# Patient Record
Sex: Female | Born: 1965 | Race: White | Smoking: Never smoker
Health system: Southern US, Community
[De-identification: ages and names within clinical notes are randomized; demographics above are authoritative.]

## PROBLEM LIST (undated history)

## (undated) DIAGNOSIS — N871 Moderate cervical dysplasia: Secondary | ICD-10-CM

## (undated) DIAGNOSIS — R87613 High grade squamous intraepithelial lesion on cytologic smear of cervix (HGSIL): Secondary | ICD-10-CM

## (undated) HISTORY — DX: High grade squamous intraepithelial lesion on cytologic smear of cervix (HGSIL): R87.613

## (undated) HISTORY — DX: Moderate cervical dysplasia: N87.1

## (undated) HISTORY — PX: MOUTH SURGERY: SHX715

---

## 2007-12-11 ENCOUNTER — Ambulatory Visit: Payer: Self-pay | Admitting: Unknown Physician Specialty

## 2008-12-16 ENCOUNTER — Ambulatory Visit: Payer: Self-pay | Admitting: Unknown Physician Specialty

## 2010-10-19 ENCOUNTER — Ambulatory Visit: Payer: Self-pay | Admitting: Unknown Physician Specialty

## 2014-11-18 ENCOUNTER — Other Ambulatory Visit: Payer: Self-pay | Admitting: Obstetrics & Gynecology

## 2014-11-18 DIAGNOSIS — Z1231 Encounter for screening mammogram for malignant neoplasm of breast: Secondary | ICD-10-CM

## 2014-12-16 ENCOUNTER — Ambulatory Visit
Admission: RE | Admit: 2014-12-16 | Discharge: 2014-12-16 | Disposition: A | Discharge status: Home / Self Care | Payer: BLUE CROSS/BLUE SHIELD | Source: Ambulatory Visit | Attending: Obstetrics & Gynecology | Admitting: Obstetrics & Gynecology

## 2014-12-16 DIAGNOSIS — Z1231 Encounter for screening mammogram for malignant neoplasm of breast: Secondary | ICD-10-CM | POA: Diagnosis not present

## 2015-11-17 ENCOUNTER — Other Ambulatory Visit: Payer: Self-pay | Admitting: Obstetrics & Gynecology

## 2015-11-17 DIAGNOSIS — Z1231 Encounter for screening mammogram for malignant neoplasm of breast: Secondary | ICD-10-CM

## 2015-12-29 ENCOUNTER — Ambulatory Visit
Admission: RE | Admit: 2015-12-29 | Discharge: 2015-12-29 | Disposition: A | Discharge status: Home / Self Care | Payer: BLUE CROSS/BLUE SHIELD | Source: Ambulatory Visit | Attending: Obstetrics & Gynecology | Admitting: Obstetrics & Gynecology

## 2015-12-29 DIAGNOSIS — Z1231 Encounter for screening mammogram for malignant neoplasm of breast: Secondary | ICD-10-CM | POA: Diagnosis present

## 2015-12-29 LAB — HM MAMMOGRAPHY

## 2016-01-02 LAB — HM PAP SMEAR

## 2016-12-31 ENCOUNTER — Encounter: Payer: Self-pay | Admitting: Obstetrics & Gynecology

## 2017-01-10 ENCOUNTER — Ambulatory Visit (INDEPENDENT_AMBULATORY_CARE_PROVIDER_SITE_OTHER): Payer: BLUE CROSS/BLUE SHIELD | Admitting: Obstetrics & Gynecology

## 2017-01-10 ENCOUNTER — Encounter: Payer: Self-pay | Admitting: Obstetrics & Gynecology

## 2017-01-10 VITALS — BP 120/80 | HR 81 | Ht 63.0 in | Wt 130.0 lb

## 2017-01-10 DIAGNOSIS — Z01419 Encounter for gynecological examination (general) (routine) without abnormal findings: Secondary | ICD-10-CM

## 2017-01-10 DIAGNOSIS — Z131 Encounter for screening for diabetes mellitus: Secondary | ICD-10-CM | POA: Diagnosis not present

## 2017-01-10 DIAGNOSIS — Z1231 Encounter for screening mammogram for malignant neoplasm of breast: Secondary | ICD-10-CM | POA: Diagnosis not present

## 2017-01-10 DIAGNOSIS — Z87410 Personal history of cervical dysplasia: Secondary | ICD-10-CM | POA: Diagnosis not present

## 2017-01-10 DIAGNOSIS — E7800 Pure hypercholesterolemia, unspecified: Secondary | ICD-10-CM | POA: Diagnosis not present

## 2017-01-10 DIAGNOSIS — Z1329 Encounter for screening for other suspected endocrine disorder: Secondary | ICD-10-CM

## 2017-01-10 DIAGNOSIS — Z1321 Encounter for screening for nutritional disorder: Secondary | ICD-10-CM | POA: Diagnosis not present

## 2017-01-10 DIAGNOSIS — Z0000 Encounter for general adult medical examination without abnormal findings: Secondary | ICD-10-CM

## 2017-01-10 DIAGNOSIS — Z1239 Encounter for other screening for malignant neoplasm of breast: Secondary | ICD-10-CM

## 2017-01-10 NOTE — Patient Instructions (Addendum)
PAP every year Mammogram every year    Call [phone] to schedule at [facility] Colonoscopy every 10 years starting next year Labs yearly

## 2017-01-10 NOTE — Progress Notes (Signed)
HPI:      [name] is a 51 y.o. G2P1011 who LMP was Patient's last menstrual period was 12/23/2016., she presents today for her annual examination. The patient has no complaints today. The patient is sexually active. Her last pap: approximate date 2017 and was normal and has h/o ASCUS and dysplasia in past; and last mammogram: approximate date 2017 and was normal. The patient does perform self breast exams.  There is no notable family history of breast or ovarian cancer in her family.  The patient has regular exercise: yes.  The patient denies current symptoms of depression.   Stopped OCP and has had about 3 periods over last year, rare hot flash  GYN History: Contraception: OCP (estrogen/progesterone)- has stopped  PMHx: Past Medical History:  Diagnosis Date  . CIN II (cervical intraepithelial neoplasia II)   . HGSIL (high grade squamous intraepithelial lesion) on Pap smear of cervix   . Moderate cervical dysplasia    Past Surgical History:  Procedure Laterality Date  . MOUTH SURGERY     Family History  Problem Relation Age of Onset  . Diabetes Mother    Social History   Tobacco Use  . Smoking status: Never Smoker  . Smokeless tobacco: Never Used  Substance Use Topics  . Alcohol use: No    Frequency: Never  . Drug use: No   No current outpatient medications on file. Allergies: Patient has no known allergies.  Review of Systems  Constitutional: Negative for chills, fever and malaise/fatigue.  HENT: Negative for congestion, sinus pain and sore throat.   Eyes: Negative for blurred vision and pain.  Respiratory: Negative for cough and wheezing.   Cardiovascular: Negative for chest pain and leg swelling.  Gastrointestinal: Negative for abdominal pain, constipation, diarrhea, heartburn, nausea and vomiting.  Genitourinary: Negative for dysuria, frequency, hematuria and urgency.  Musculoskeletal: Negative for back pain, joint pain, myalgias and neck pain.  Skin:  Negative for itching and rash.  Neurological: Negative for dizziness, tremors and weakness.  Endo/Heme/Allergies: Does not bruise/bleed easily.  Psychiatric/Behavioral: Negative for depression. The patient is not nervous/anxious and does not have insomnia.     Objective: BP 120/80   Pulse 81   Ht 5\' 3"  (1.6 m)   Wt 130 lb (59 kg)   LMP 12/23/2016   BMI 23.03 kg/m   Filed Weights   01/10/17 1417  Weight: 130 lb (59 kg)   Body mass index is 23.03 kg/m. Physical Exam  Constitutional: She is oriented to person, place, and time. She appears well-developed and well-nourished. No distress.  Genitourinary: Rectum normal, vagina normal and uterus normal. Pelvic exam was performed with patient supine. There is no rash or lesion on the right labia. There is no rash or lesion on the left labia. Vagina exhibits no lesion. No bleeding in the vagina. Right adnexum does not display mass and does not display tenderness. Left adnexum does not display mass and does not display tenderness. Cervix does not exhibit motion tenderness, lesion, friability or polyp.   Uterus is mobile and midaxial. Uterus is not enlarged or exhibiting a mass.  HENT:  Head: Normocephalic and atraumatic. Head is without laceration.  Right Ear: Hearing normal.  Left Ear: Hearing normal.  Nose: No epistaxis.  No foreign bodies.  Mouth/Throat: Uvula is midline, oropharynx is clear and moist and mucous membranes are normal.  Eyes: Pupils are equal, round, and reactive to light.  Neck: Normal range of motion. Neck supple. No thyromegaly present.  Cardiovascular: Normal rate and regular rhythm. Exam reveals no gallop and no friction rub.  No murmur heard. Pulmonary/Chest: Effort normal and breath sounds normal. No respiratory distress. She has no wheezes. Right breast exhibits no mass, no skin change and no tenderness. Left breast exhibits no mass, no skin change and no tenderness.  Abdominal: Soft. Bowel sounds are normal. She  exhibits no distension. There is no tenderness. There is no rebound.  Musculoskeletal: Normal range of motion.  Neurological: She is alert and oriented to person, place, and time. No cranial nerve deficit.  Skin: Skin is warm and dry.  Psychiatric: She has a normal mood and affect. Judgment normal.  Vitals reviewed.  Assessment:  ANNUAL EXAM 1. Annual physical exam   2. History of cervical dysplasia   3. Screening for breast cancer    Screening Plan:            1.  Cervical Screening-  Pap smear done today  2. Breast screening- Exam annually and mammogram>40 planned   3. Colonoscopy every 10 years, Hemoccult testing - after age 35  4. Labs Ordered today for future (prior high cholesterol)  5. Counseling for contraception:no need to continue oral contraceptives (estrogen/progesterone); monitor perimenopausal sx's.    F/U  Return in about 1 year (around 01/10/2018) for Annual.  Barnett Applebaum, MD, [facility], [facility] 01/10/2017  2:25 PM

## 2017-01-12 LAB — PAP IG (IMAGE GUIDED): PAP Smear Comment: 0

## 2017-01-13 NOTE — Progress Notes (Signed)
LM, repeat PAP one year

## 2017-02-07 ENCOUNTER — Encounter: Payer: Self-pay | Admitting: Obstetrics & Gynecology

## 2017-02-07 ENCOUNTER — Ambulatory Visit
Admission: RE | Admit: 2017-02-07 | Discharge: 2017-02-07 | Disposition: A | Discharge status: Home / Self Care | Payer: BLUE CROSS/BLUE SHIELD | Source: Ambulatory Visit | Attending: Obstetrics & Gynecology | Admitting: Obstetrics & Gynecology

## 2017-02-07 ENCOUNTER — Encounter: Payer: Self-pay | Admitting: Radiology

## 2017-02-07 DIAGNOSIS — Z1231 Encounter for screening mammogram for malignant neoplasm of breast: Secondary | ICD-10-CM | POA: Insufficient documentation

## 2017-02-07 DIAGNOSIS — Z1239 Encounter for other screening for malignant neoplasm of breast: Secondary | ICD-10-CM

## 2017-04-15 ENCOUNTER — Telehealth: Payer: Self-pay

## 2017-04-15 NOTE — Telephone Encounter (Signed)
Patient referring voicemail from [place]. Please advise

## 2017-04-15 NOTE — Telephone Encounter (Signed)
-----   Message from Gae Dry, MD sent at 04/15/2017  8:03 AM EDT ----- Regarding: labs Check on patient and see if she still wants to come by fasting to have her labs drawn, especially with concerns for her cholesterol.  Schedule if she does.  Thanks. (these were ordered at her annual in Dec, and she may have overlooked getting them done)

## 2017-04-15 NOTE — Telephone Encounter (Signed)
Called pt and left message to return call.

## 2017-05-06 ENCOUNTER — Telehealth: Payer: Self-pay

## 2017-05-06 NOTE — Telephone Encounter (Signed)
Left message again to discuss labs

## 2017-05-09 ENCOUNTER — Other Ambulatory Visit: Payer: BLUE CROSS/BLUE SHIELD

## 2017-05-09 DIAGNOSIS — E7800 Pure hypercholesterolemia, unspecified: Secondary | ICD-10-CM

## 2017-05-09 DIAGNOSIS — Z131 Encounter for screening for diabetes mellitus: Secondary | ICD-10-CM

## 2017-05-09 DIAGNOSIS — Z1329 Encounter for screening for other suspected endocrine disorder: Secondary | ICD-10-CM

## 2017-05-09 DIAGNOSIS — Z1321 Encounter for screening for nutritional disorder: Secondary | ICD-10-CM

## 2017-05-10 ENCOUNTER — Encounter: Payer: Self-pay | Admitting: Obstetrics & Gynecology

## 2017-05-10 LAB — LIPID PANEL
CHOL/HDL RATIO: 3.7 ratio (ref 0.0–4.4)
CHOLESTEROL TOTAL: 198 mg/dL (ref 100–199)
HDL: 54 mg/dL (ref >39)
LDL CALC: 120 mg/dL — AB (ref 0–99)
Triglycerides: 119 mg/dL (ref 0–149)
VLDL CHOLESTEROL CAL: 24 mg/dL (ref 5–40)

## 2017-05-10 LAB — GLUCOSE, FASTING: Glucose, Plasma: 70 mg/dL (ref 65–99)

## 2017-05-10 LAB — VITAMIN D 25 HYDROXY (VIT D DEFICIENCY, FRACTURES): Vit D, 25-Hydroxy: 20.6 ng/mL — ABNORMAL LOW (ref 30.0–100.0)

## 2017-05-10 LAB — TSH: TSH: 4.07 u[IU]/mL (ref 0.450–4.500)

## 2018-02-13 ENCOUNTER — Other Ambulatory Visit: Payer: Self-pay | Admitting: Obstetrics & Gynecology

## 2018-02-13 DIAGNOSIS — Z1231 Encounter for screening mammogram for malignant neoplasm of breast: Secondary | ICD-10-CM

## 2018-02-27 ENCOUNTER — Other Ambulatory Visit: Payer: Self-pay | Admitting: Obstetrics & Gynecology

## 2018-02-27 ENCOUNTER — Ambulatory Visit
Admission: RE | Admit: 2018-02-27 | Discharge: 2018-02-27 | Disposition: A | Discharge status: Home / Self Care | Payer: BLUE CROSS/BLUE SHIELD | Source: Ambulatory Visit | Attending: Obstetrics & Gynecology | Admitting: Obstetrics & Gynecology

## 2018-02-27 ENCOUNTER — Encounter: Payer: Self-pay | Admitting: Obstetrics & Gynecology

## 2018-02-27 ENCOUNTER — Ambulatory Visit (INDEPENDENT_AMBULATORY_CARE_PROVIDER_SITE_OTHER): Payer: BLUE CROSS/BLUE SHIELD | Admitting: Obstetrics & Gynecology

## 2018-02-27 ENCOUNTER — Other Ambulatory Visit (HOSPITAL_COMMUNITY)
Admission: RE | Admit: 2018-02-27 | Discharge: 2018-02-27 | Disposition: A | Discharge status: Home / Self Care | Payer: BLUE CROSS/BLUE SHIELD | Source: Ambulatory Visit | Attending: Obstetrics & Gynecology | Admitting: Obstetrics & Gynecology

## 2018-02-27 VITALS — BP 110/70 | Ht 63.0 in | Wt 136.0 lb

## 2018-02-27 DIAGNOSIS — Z87410 Personal history of cervical dysplasia: Secondary | ICD-10-CM

## 2018-02-27 DIAGNOSIS — Z1211 Encounter for screening for malignant neoplasm of colon: Secondary | ICD-10-CM

## 2018-02-27 DIAGNOSIS — Z1231 Encounter for screening mammogram for malignant neoplasm of breast: Secondary | ICD-10-CM | POA: Diagnosis not present

## 2018-02-27 DIAGNOSIS — Z01419 Encounter for gynecological examination (general) (routine) without abnormal findings: Secondary | ICD-10-CM

## 2018-02-27 DIAGNOSIS — R928 Other abnormal and inconclusive findings on diagnostic imaging of breast: Secondary | ICD-10-CM

## 2018-02-27 DIAGNOSIS — N6320 Unspecified lump in the left breast, unspecified quadrant: Secondary | ICD-10-CM

## 2018-02-27 NOTE — Progress Notes (Signed)
HPI:      [name] is a 53 y.o. G2P1011 who LMP was in the past, she presents today for her annual examination.  The patient has no complaints today. The patient is sexually active. Herlast pap: approximate date 11/2016 and was normal and has a h/o ASCUS and last mammogram: approximate date 11/2016 and was normal.  The patient does perform self breast exams.  There is no notable family history of breast or ovarian cancer in her family. The patient is not taking hormone replacement therapy. Patient denies post-menopausal vaginal bleeding. She is perimenopausal with period 3-4 times per year still, some hot flashes and night sweats but not disruptive.  The patient has regular exercise: yes. The patient denies current symptoms of depression.    GYN Hx: Last Colonoscopy- not done yet.   PMHx: Past Medical History:  Diagnosis Date  . CIN II (cervical intraepithelial neoplasia II)   . HGSIL (high grade squamous intraepithelial lesion) on Pap smear of cervix   . Moderate cervical dysplasia    Past Surgical History:  Procedure Laterality Date  . MOUTH SURGERY     Family History  Problem Relation Age of Onset  . Diabetes Mother   . Breast cancer Neg Hx    Social History   Tobacco Use  . Smoking status: Never Smoker  . Smokeless tobacco: Never Used  Substance Use Topics  . Alcohol use: No    Frequency: Never  . Drug use: No   No current outpatient medications on file. Allergies: Patient has no known allergies.  Review of Systems  Constitutional: Negative for chills, fever and malaise/fatigue.  HENT: Negative for congestion, sinus pain and sore throat.   Eyes: Negative for blurred vision and pain.  Respiratory: Negative for cough and wheezing.   Cardiovascular: Negative for chest pain and leg swelling.  Gastrointestinal: Negative for abdominal pain, constipation, diarrhea, heartburn, nausea and vomiting.  Genitourinary: Negative for dysuria, frequency, hematuria and  urgency.  Musculoskeletal: Negative for back pain, joint pain, myalgias and neck pain.  Skin: Negative for itching and rash.  Neurological: Negative for dizziness, tremors and weakness.  Endo/Heme/Allergies: Does not bruise/bleed easily.  Psychiatric/Behavioral: Negative for depression. The patient is not nervous/anxious and does not have insomnia.     Objective: BP 110/70   Ht 5\' 3"  (1.6 m)   Wt 136 lb (61.7 kg)   LMP 01/28/2018   BMI 24.09 kg/m   Filed Weights   02/27/18 1010  Weight: 136 lb (61.7 kg)   Body mass index is 24.09 kg/m. Physical Exam Constitutional:      General: She is not in acute distress.    Appearance: She is well-developed.  Genitourinary:     Pelvic exam was performed with patient supine.     Vagina, uterus and rectum normal.     No lesions in the vagina.     No vaginal bleeding.     No cervical motion tenderness, friability, lesion or polyp.     Uterus is mobile.     Uterus is not enlarged.     No uterine mass detected.    Uterus is midaxial.     No right or left adnexal mass present.     Right adnexa not tender.     Left adnexa not tender.  HENT:     Head: Normocephalic and atraumatic. No laceration.     Right Ear: Hearing normal.     Left Ear: Hearing normal.     Mouth/Throat:  Pharynx: Uvula midline.  Eyes:     Pupils: Pupils are equal, round, and reactive to light.  Neck:     Musculoskeletal: Normal range of motion and neck supple.     Thyroid: No thyromegaly.  Cardiovascular:     Rate and Rhythm: Normal rate and regular rhythm.     Heart sounds: No murmur. No friction rub. No gallop.   Pulmonary:     Effort: Pulmonary effort is normal. No respiratory distress.     Breath sounds: Normal breath sounds. No wheezing.  Chest:     Breasts:        Right: No mass, skin change or tenderness.        Left: No mass, skin change or tenderness.  Abdominal:     General: Bowel sounds are normal. There is no distension.     Palpations:  Abdomen is soft.     Tenderness: There is no abdominal tenderness. There is no rebound.  Musculoskeletal: Normal range of motion.  Neurological:     Mental Status: She is alert and oriented to person, place, and time.     Cranial Nerves: No cranial nerve deficit.  Skin:    General: Skin is warm and dry.  Psychiatric:        Judgment: Judgment normal.  Vitals signs reviewed.   Assessment: Annual Exam 1. Women's annual routine gynecological examination   2. History of cervical dysplasia   3. Screen for colon cancer    Plan:            1.  Cervical Screening-  Pap smear done today  2. Breast screening- Exam annually and mammogram scheduled  3. Colonoscopy every 10 years, Hemoccult testing after age 36  4. Labs to be repeated next year  Pt to increase routine use of Calcium and Vitamin D daily (due to prior h/o Vit D deficiency)  5. Counseling for hormonal therapy: none     F/U  Return in about 1 year (around 02/28/2019) for Annual.  Barnett Applebaum, MD, [facility], [facility] 02/27/2018  10:40 AM

## 2018-02-27 NOTE — Patient Instructions (Signed)
PAP every year Mammogram every year    Call [phone] to schedule at [facility] Colonoscopy every 10 years Labs next year  CALCIUM 1500 mg daily Vitamin D 1000 units daily at least

## 2018-02-28 ENCOUNTER — Other Ambulatory Visit: Payer: Self-pay

## 2018-02-28 ENCOUNTER — Telehealth: Payer: Self-pay | Admitting: Gastroenterology

## 2018-02-28 DIAGNOSIS — Z1211 Encounter for screening for malignant neoplasm of colon: Secondary | ICD-10-CM

## 2018-02-28 LAB — CYTOLOGY - PAP: DIAGNOSIS: NEGATIVE

## 2018-02-28 NOTE — Telephone Encounter (Signed)
Call returned.  Patient has been scheduled for colonoscopy with Dr. Vicente Males on 03/13/18 at [facility].  Thanks [employer]

## 2018-02-28 NOTE — Telephone Encounter (Signed)
Patient returned call to schedule colonoscopy.  Please call back to schedule.

## 2018-03-02 ENCOUNTER — Ambulatory Visit
Admission: RE | Admit: 2018-03-02 | Discharge: 2018-03-02 | Disposition: A | Discharge status: Home / Self Care | Payer: BLUE CROSS/BLUE SHIELD | Source: Ambulatory Visit | Attending: Obstetrics & Gynecology | Admitting: Obstetrics & Gynecology

## 2018-03-02 DIAGNOSIS — R928 Other abnormal and inconclusive findings on diagnostic imaging of breast: Secondary | ICD-10-CM | POA: Diagnosis present

## 2018-03-02 DIAGNOSIS — N6320 Unspecified lump in the left breast, unspecified quadrant: Secondary | ICD-10-CM | POA: Diagnosis present

## 2018-03-13 ENCOUNTER — Encounter
Admission: RE | Disposition: A | Discharge status: Home / Self Care | Payer: Self-pay | Source: Home / Self Care | Attending: Gastroenterology

## 2018-03-13 ENCOUNTER — Ambulatory Visit
Admission: RE | Admit: 2018-03-13 | Discharge: 2018-03-13 | Disposition: A | Discharge status: Home / Self Care | Payer: BLUE CROSS/BLUE SHIELD | Attending: Gastroenterology | Admitting: Gastroenterology

## 2018-03-13 ENCOUNTER — Other Ambulatory Visit: Payer: Self-pay

## 2018-03-13 ENCOUNTER — Ambulatory Visit: Payer: BLUE CROSS/BLUE SHIELD | Admitting: Anesthesiology

## 2018-03-13 ENCOUNTER — Encounter: Payer: Self-pay | Admitting: Student

## 2018-03-13 DIAGNOSIS — Z833 Family history of diabetes mellitus: Secondary | ICD-10-CM | POA: Insufficient documentation

## 2018-03-13 DIAGNOSIS — D125 Benign neoplasm of sigmoid colon: Secondary | ICD-10-CM | POA: Diagnosis not present

## 2018-03-13 DIAGNOSIS — K635 Polyp of colon: Secondary | ICD-10-CM | POA: Diagnosis not present

## 2018-03-13 DIAGNOSIS — K640 First degree hemorrhoids: Secondary | ICD-10-CM | POA: Insufficient documentation

## 2018-03-13 DIAGNOSIS — Z1211 Encounter for screening for malignant neoplasm of colon: Secondary | ICD-10-CM | POA: Diagnosis not present

## 2018-03-13 HISTORY — PX: COLONOSCOPY WITH PROPOFOL: SHX5780

## 2018-03-13 SURGERY — COLONOSCOPY WITH PROPOFOL
Anesthesia: General

## 2018-03-13 MED ORDER — SODIUM CHLORIDE 0.9 % IV SOLN
INTRAVENOUS | Status: DC
  Administered 2018-03-13: 1000 mL via INTRAVENOUS

## 2018-03-13 MED ORDER — PROPOFOL 10 MG/ML IV BOLUS
INTRAVENOUS | Status: DC | PRN
  Administered 2018-03-13: 60 mg via INTRAVENOUS

## 2018-03-13 MED ORDER — LIDOCAINE 2% (20 MG/ML) 5 ML SYRINGE
INTRAMUSCULAR | Status: DC | PRN
  Administered 2018-03-13: 100 mg via INTRAVENOUS

## 2018-03-13 MED ORDER — LIDOCAINE HCL (PF) 2 % IJ SOLN
INTRAMUSCULAR | Status: AC
  Filled 2018-03-13: qty 10

## 2018-03-13 MED ORDER — PROPOFOL 500 MG/50ML IV EMUL
INTRAVENOUS | Status: DC | PRN
  Administered 2018-03-13: 150 ug/kg/min via INTRAVENOUS

## 2018-03-13 MED ORDER — PROPOFOL 500 MG/50ML IV EMUL
INTRAVENOUS | Status: AC
  Filled 2018-03-13: qty 50

## 2018-03-13 NOTE — H&P (Signed)
Jonathon Bellows, MD [address], [place], [place], [place], [address] [address], [place], [place], [place], [address] Phone: [phone]  Fax: [phone]  Primary Care Physician:  Mortimer Fries, MD   Pre-Procedure History & Physical: HPI:  Regina Hickman is a 53 y.o. female is here for an colonoscopy.   Past Medical History:  Diagnosis Date  . CIN II (cervical intraepithelial neoplasia II)   . HGSIL (high grade squamous intraepithelial lesion) on Pap smear of cervix   . Moderate cervical dysplasia     Past Surgical History:  Procedure Laterality Date  . MOUTH SURGERY      Prior to Admission medications   Not on File    Allergies as of 02/28/2018  . (No Known Allergies)    Family History  Problem Relation Age of Onset  . Diabetes Mother   . Breast cancer Neg Hx     Social History   Socioeconomic History  . Marital status: Married    Spouse name: Not on file  . Number of children: Not on file  . Years of education: Not on file  . Highest education level: Not on file  Occupational History  . Not on file  Social Needs  . Financial resource strain: Not on file  . Food insecurity:    Worry: Not on file    Inability: Not on file  . Transportation needs:    Medical: Not on file    Non-medical: Not on file  Tobacco Use  . Smoking status: Never Smoker  . Smokeless tobacco: Never Used  Substance and Sexual Activity  . Alcohol use: No    Frequency: Never  . Drug use: No  . Sexual activity: Yes    Birth control/protection: None  Lifestyle  . Physical activity:    Days per week: Not on file    Minutes per session: Not on file  . Stress: Not on file  Relationships  . Social connections:    Talks on phone: Not on file    Gets together: Not on file    Attends religious service: Not on file    Active member of club or organization: Not on file    Attends meetings of clubs or organizations: Not on file    Relationship status: Not on file  .  Intimate partner violence:    Fear of current or ex partner: Not on file    Emotionally abused: Not on file    Physically abused: Not on file    Forced sexual activity: Not on file  Other Topics Concern  . Not on file  Social History Narrative  . Not on file    Review of Systems: See HPI, otherwise negative ROS  Physical Exam: BP (!) 106/56   Pulse 86   Temp 97.8 F (36.6 C) (Oral)   Resp 18   Ht 5\' 3"  (1.6 m)   Wt 59.4 kg   SpO2 98%   BMI 23.21 kg/m  General:   Alert,  pleasant and cooperative in NAD Head:  Normocephalic and atraumatic. Neck:  Supple; no masses or thyromegaly. Lungs:  Clear throughout to auscultation, normal respiratory effort.    Heart:  +S1, +S2, Regular rate and rhythm, No edema. Abdomen:  Soft, nontender and nondistended. Normal bowel sounds, without guarding, and without rebound.   Neurologic:  Alert and  oriented x4;  grossly normal neurologically.  Impression/Plan: Regina Hickman is here for an colonoscopy to be performed for  Screening colonoscopy average risk   Risks, benefits, limitations, and alternatives regarding  colonoscopy have been reviewed with the patient.  Questions have been answered.  All parties agreeable.   Jonathon Bellows, MD  03/13/2018, 7:59 AM

## 2018-03-13 NOTE — Op Note (Signed)
[facility] Patient Name: Regina Hickman Procedure Date: 03/13/2018 7:15 AM MRN: [id number] Account #: [id number] Date of Birth: Sep 09, 1965 Admit Type: Outpatient Age: 53 Room: [facility] ENDO ROOM 2 Gender: Female Note Status: Finalized Procedure:            Colonoscopy Indications:          Screening for colorectal malignant neoplasm Providers:            Jonathon Bellows MD, MD Referring MD:         Vincent Gros. Kenton Kingfisher (Referring MD) Medicines:            Monitored Anesthesia Care Complications:        No immediate complications. Procedure:            Pre-Anesthesia Assessment:                       - Prior to the procedure, a History and Physical was                        performed, and patient medications, allergies and                        sensitivities were reviewed. The patient's tolerance of                        previous anesthesia was reviewed.                       - The risks and benefits of the procedure and the                        sedation options and risks were discussed with the                        patient. All questions were answered and informed                        consent was obtained.                       - After reviewing the risks and benefits, the patient                        was deemed in satisfactory condition to undergo the                        procedure.                       - ASA Grade Assessment: II - A patient with mild                        systemic disease.                       After obtaining informed consent, the colonoscope was                        passed under direct vision. Throughout the procedure,                        the  patient's blood pressure, pulse, and oxygen                        saturations were monitored continuously. The                        Colonoscope was introduced through the anus and                        advanced to the the cecum, identified by the   appendiceal orifice, IC valve and transillumination.                        The colonoscopy was performed with ease. The patient                        tolerated the procedure well. The quality of the bowel                        preparation was good. Findings:      The perianal and digital rectal examinations were normal.      Non-bleeding internal hemorrhoids were found during retroflexion. The       hemorrhoids were large and Grade I (internal hemorrhoids that do not       prolapse).      A 3 mm polyp was found in the sigmoid colon. The polyp was sessile. The       polyp was removed with a cold biopsy forceps. Resection and retrieval       were complete.      The exam was otherwise without abnormality on direct and retroflexion       views. Impression:           - Non-bleeding internal hemorrhoids.                       - One 3 mm polyp in the sigmoid colon, removed with a                        cold biopsy forceps. Resected and retrieved.                       - The examination was otherwise normal on direct and                        retroflexion views. Recommendation:       - Discharge patient to home (with escort).                       - Resume previous diet.                       - Continue present medications.                       - Await pathology results.                       - Repeat colonoscopy for surveillance based on                        pathology results. Procedure Code(s):    --- Professional ---  45380, Colonoscopy, flexible; with biopsy, single or                        multiple Diagnosis Code(s):    --- Professional ---                       Z12.11, Encounter for screening for malignant neoplasm                        of colon                       D12.5, Benign neoplasm of sigmoid colon                       K64.0, First degree hemorrhoids CPT copyright 2018 American Medical Association. All rights reserved. The codes documented in  this report are preliminary and upon coder review may  be revised to meet current compliance requirements. Jonathon Bellows, MD Jonathon Bellows MD, MD 03/13/2018 8:27:49 AM This report has been signed electronically. Number of Addenda: 0 Note Initiated On: 03/13/2018 7:15 AM Scope Withdrawal Time: 0 hours 14 minutes 32 seconds  Total Procedure Duration: 0 hours 17 minutes 9 seconds       [facility]

## 2018-03-13 NOTE — Anesthesia Preprocedure Evaluation (Signed)
Anesthesia Evaluation  Patient identified by MRN, date of birth, ID band Patient awake    Reviewed: Allergy & Precautions, H&P , NPO status , Patient's Chart, lab work & pertinent test results, reviewed documented beta blocker date and time   History of Anesthesia Complications Negative for: history of anesthetic complications  Airway Mallampati: II  TM Distance: >3 FB Neck ROM: full  Mouth opening: Limited Mouth Opening  Dental  (+) Dental Advidsory Given, Teeth Intact   Pulmonary neg pulmonary ROS,           Cardiovascular Exercise Tolerance: Good negative cardio ROS       Neuro/Psych negative neurological ROS  negative psych ROS   GI/Hepatic negative GI ROS, Neg liver ROS,   Endo/Other  negative endocrine ROS  Renal/GU negative Renal ROS  negative genitourinary   Musculoskeletal   Abdominal   Peds  Hematology negative hematology ROS (+)   Anesthesia Other Findings Past Medical History: No date: CIN II (cervical intraepithelial neoplasia II) No date: HGSIL (high grade squamous intraepithelial lesion) on Pap  smear of cervix No date: Moderate cervical dysplasia   Reproductive/Obstetrics negative OB ROS                             Anesthesia Physical Anesthesia Plan  ASA: I  Anesthesia Plan: General   Post-op Pain Management:    Induction: Intravenous  PONV Risk Score and Plan: 3 and Propofol infusion and TIVA  Airway Management Planned: Natural Airway and Nasal Cannula  Additional Equipment:   Intra-op Plan:   Post-operative Plan:   Informed Consent: I have reviewed the patients History and Physical, chart, labs and discussed the procedure including the risks, benefits and alternatives for the proposed anesthesia with the patient or authorized representative who has indicated his/her understanding and acceptance.     Dental Advisory Given  Plan Discussed with:  Anesthesiologist, CRNA and Surgeon  Anesthesia Plan Comments:         Anesthesia Quick Evaluation

## 2018-03-13 NOTE — Transfer of Care (Signed)
Immediate Anesthesia Transfer of Care Note  Patient: Regina Hickman  Procedure(s) Performed: COLONOSCOPY WITH PROPOFOL (N/A )  Patient Location: Endoscopy Unit  Anesthesia Type:General  Level of Consciousness: awake, alert  and oriented  Airway & Oxygen Therapy: Patient connected to nasal cannula oxygen  Post-op Assessment: Post -op Vital signs reviewed and stable  Post vital signs: stable  Last Vitals:  Vitals Value Taken Time  BP 98/63 03/13/2018  8:32 AM  Temp    Pulse 82 03/13/2018  8:36 AM  Resp 22 03/13/2018  8:36 AM  SpO2 100 % 03/13/2018  8:36 AM  Vitals shown include unvalidated device data.  Last Pain:  Vitals:   03/13/18 0820  TempSrc: Oral  PainSc:          Complications: No apparent anesthesia complications

## 2018-03-13 NOTE — Anesthesia Postprocedure Evaluation (Signed)
Anesthesia Post Note  Patient: Regina Hickman  Procedure(s) Performed: COLONOSCOPY WITH PROPOFOL (N/A )  Patient location during evaluation: Endoscopy Anesthesia Type: General Level of consciousness: awake and alert Pain management: pain level controlled Vital Signs Assessment: post-procedure vital signs reviewed and stable Respiratory status: spontaneous breathing, nonlabored ventilation, respiratory function stable and patient connected to nasal cannula oxygen Cardiovascular status: blood pressure returned to baseline and stable Postop Assessment: no apparent nausea or vomiting Anesthetic complications: no     Last Vitals:  Vitals:   03/13/18 0840 03/13/18 0850  BP: 96/69 105/76  Pulse: 74 70  Resp: 15 10  Temp:    SpO2: 100% 100%    Last Pain:  Vitals:   03/13/18 0820  TempSrc: Oral  PainSc:                  [name]

## 2018-03-13 NOTE — Anesthesia Post-op Follow-up Note (Signed)
Anesthesia QCDR form completed.        

## 2018-03-14 LAB — SURGICAL PATHOLOGY

## 2018-03-15 ENCOUNTER — Encounter: Payer: Self-pay | Admitting: Gastroenterology

## 2018-03-23 ENCOUNTER — Encounter: Payer: Self-pay | Admitting: Gastroenterology

## 2018-03-23 NOTE — Progress Notes (Signed)
ok 

## 2020-02-29 IMAGING — MG DIGITAL SCREENING BILATERAL MAMMOGRAM WITH TOMO AND CAD
8 series · 9 of 24 positions shown · non-contrast
Comparison: Previous exam(s).

CLINICAL DATA: Screening.

EXAM:
DIGITAL SCREENING BILATERAL MAMMOGRAM WITH TOMO AND CAD

[R CC synth-2D]
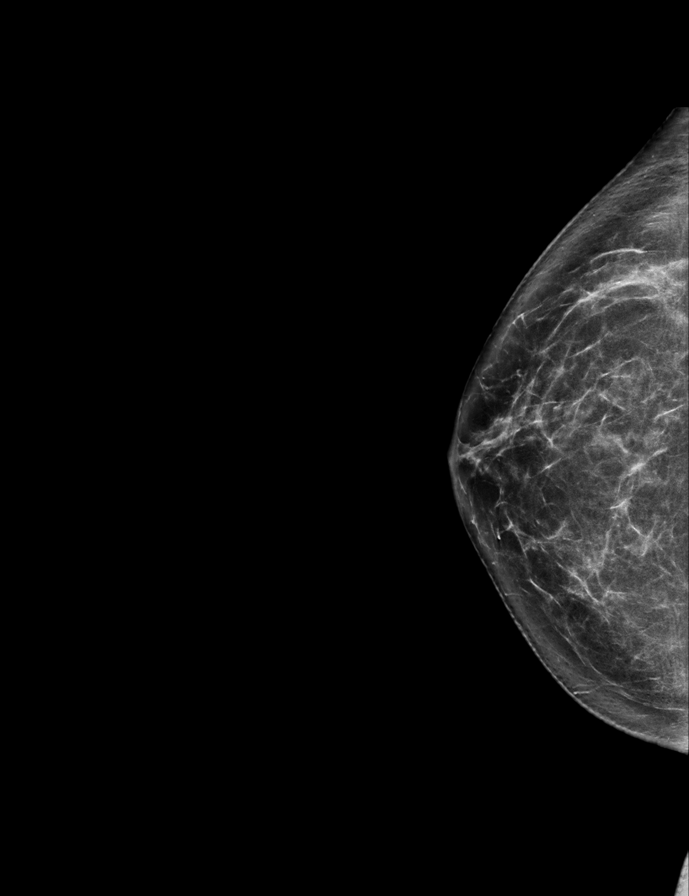

[L CC synth-2D]
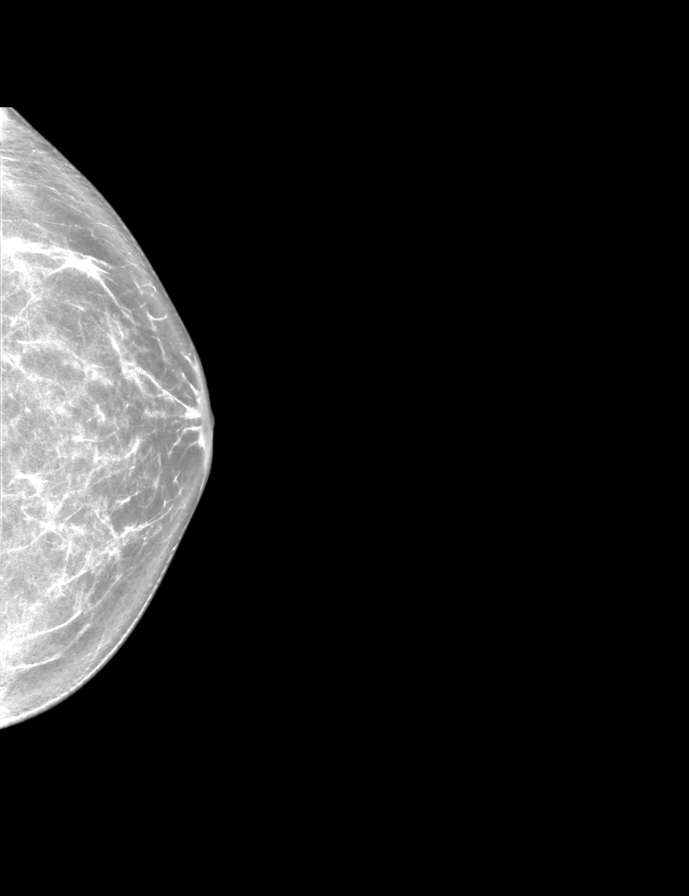

[R MLO synth-2D]
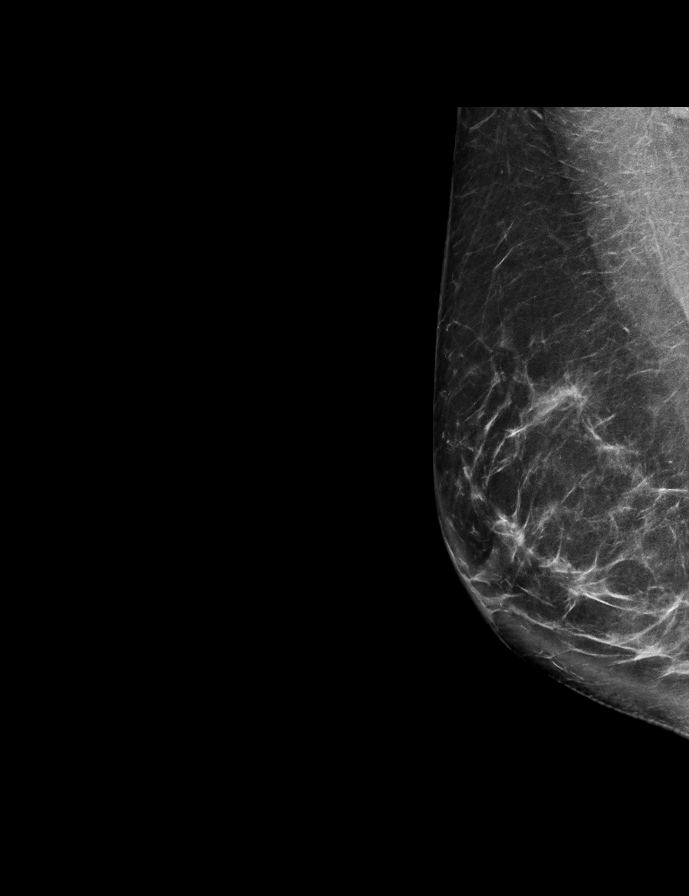

[L MLO synth-2D]
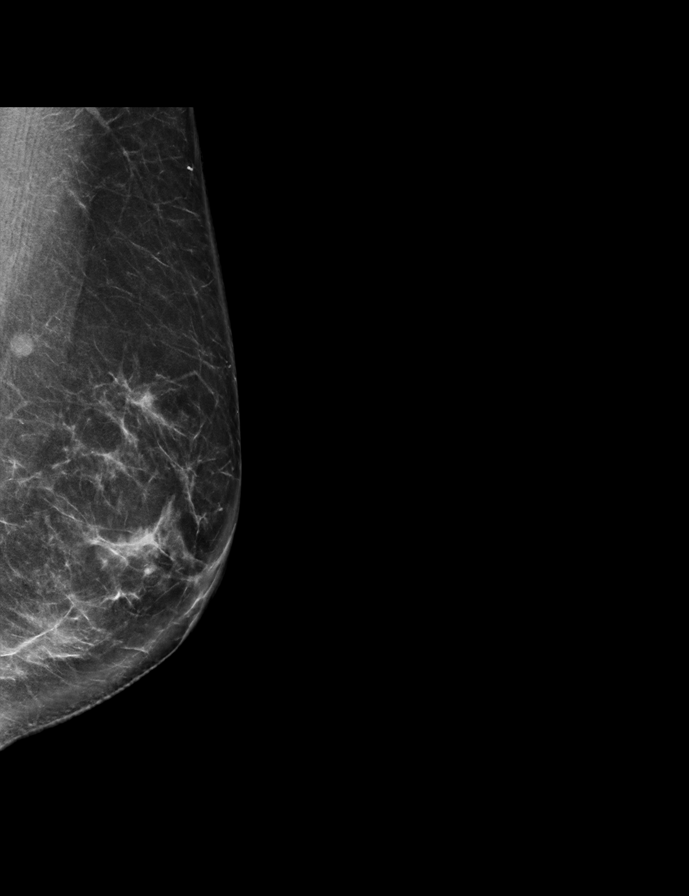

[L CC tomo · 2 of 63 frames shown]
[frame 21/63]
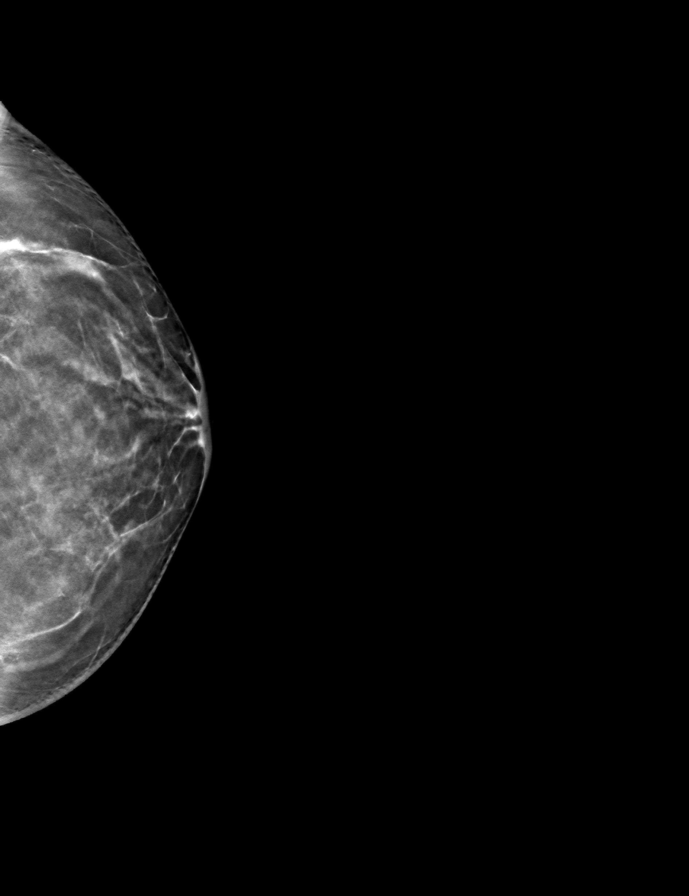
[frame 32/63]
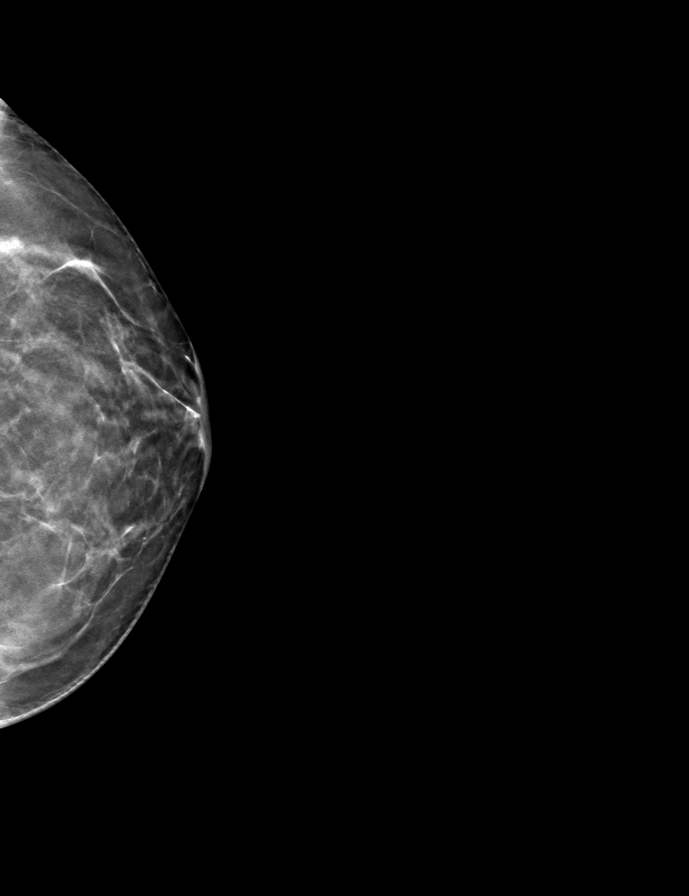

[L MLO tomo · tomo slice 39/78.0]
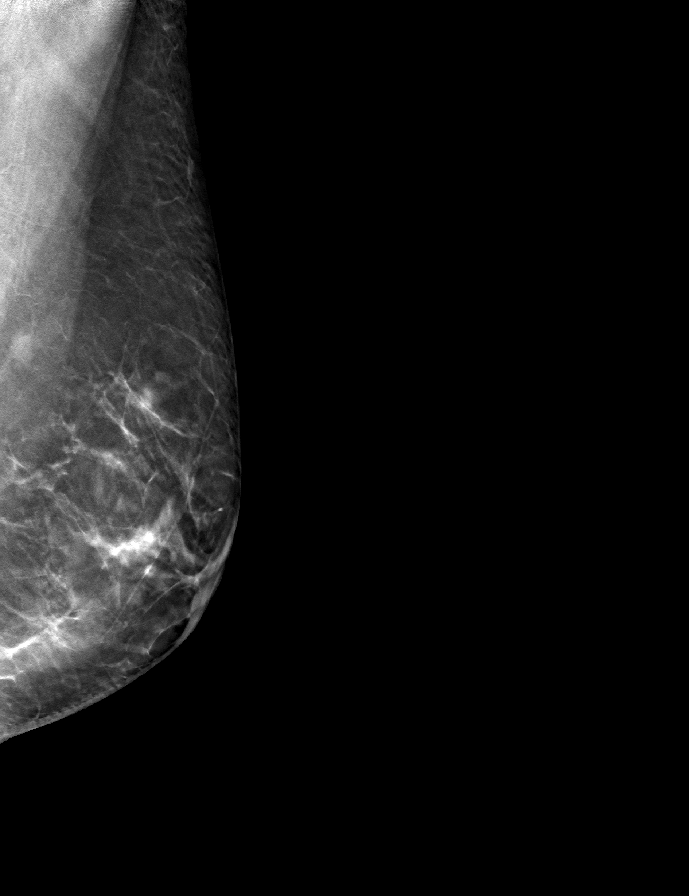

[R CC tomo · tomo slice 37/72.0]
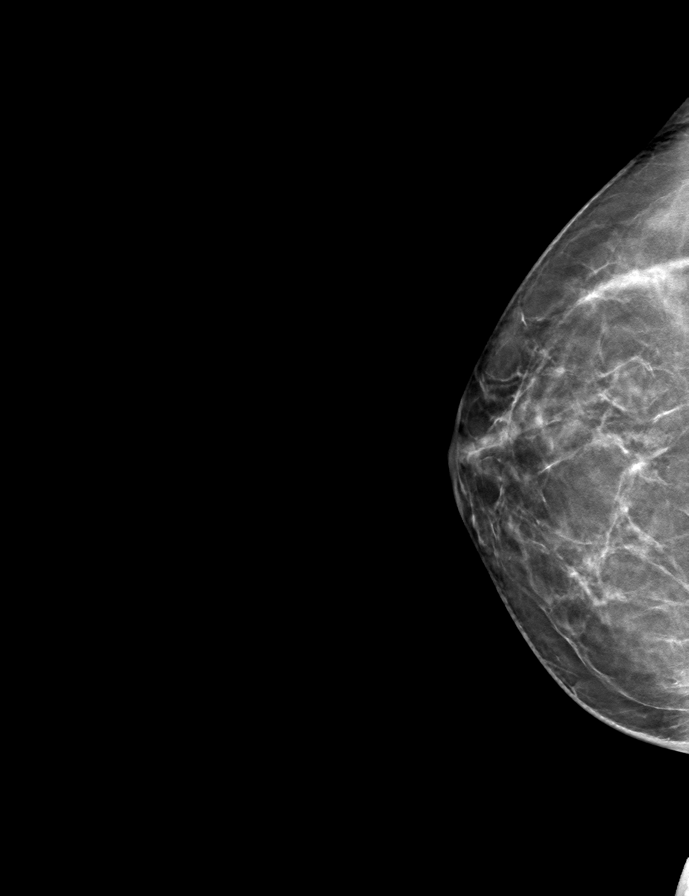

[R MLO tomo · tomo slice 42/83.0]
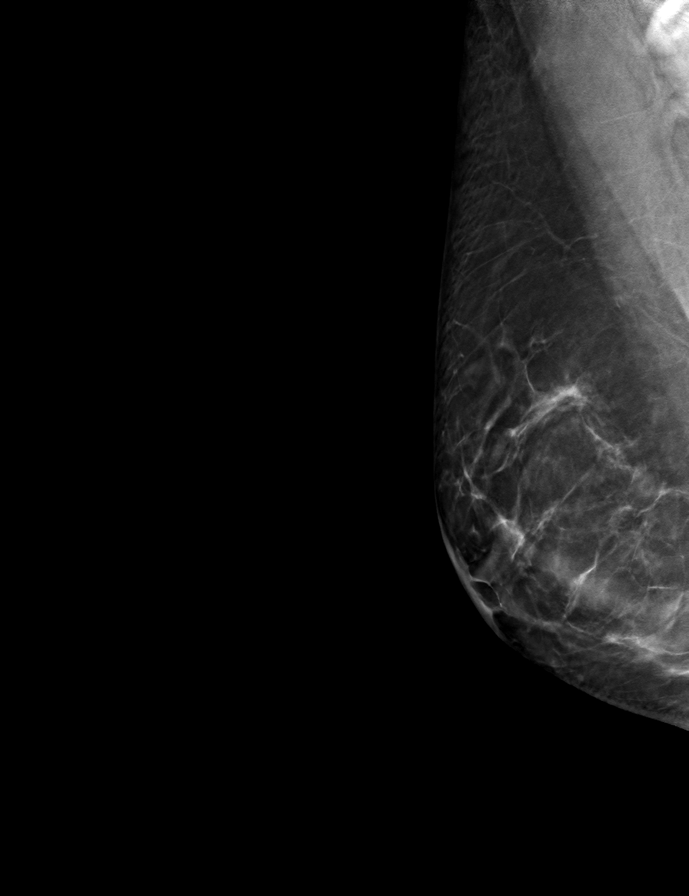

[9 of 24 positions shown; findings below may reference images not displayed]

ACR Breast Density Category b: There are scattered areas of
fibroglandular density.
FINDINGS: In the left breast, a possible mass warrants further evaluation. In
the right breast, no findings suspicious for malignancy. Images were
processed with CAD.
IMPRESSION: Further evaluation is suggested for possible mass in the left
breast.

RECOMMENDATION:
Ultrasound of the left breast. (Code:F4-8-550)

The patient will be contacted regarding the findings, and additional
imaging will be scheduled.

BI-RADS CATEGORY  0: Incomplete. Need additional imaging evaluation
and/or prior mammograms for comparison.

## 2020-03-03 IMAGING — US ULTRASOUND LEFT BREAST LIMITED
1 series · 5 of 5 positions shown · non-contrast
Comparison: Previous exam(s).

CLINICAL DATA: Patient was called back from screening mammogram for
a possible mass in the left breast.

EXAM:
ULTRASOUND OF THE LEFT BREAST

[Series 1: ultrasound left breast limited · 0.06mm/px · 5 of 5 slices shown]
[im 1/5]
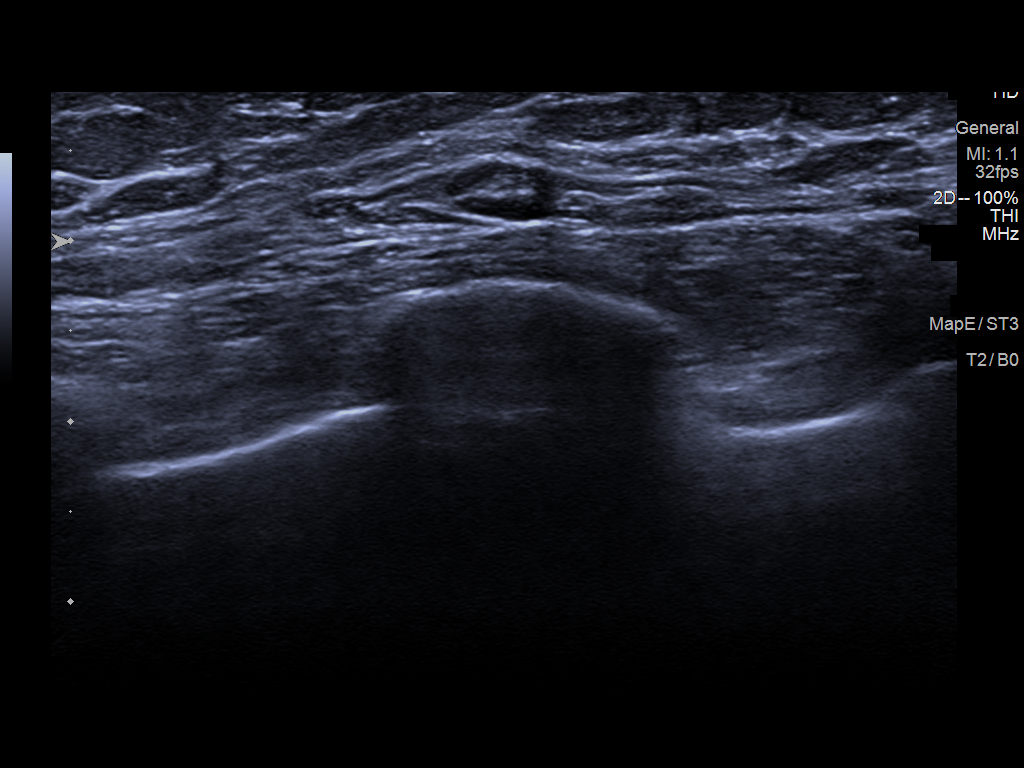
[im 2/5]
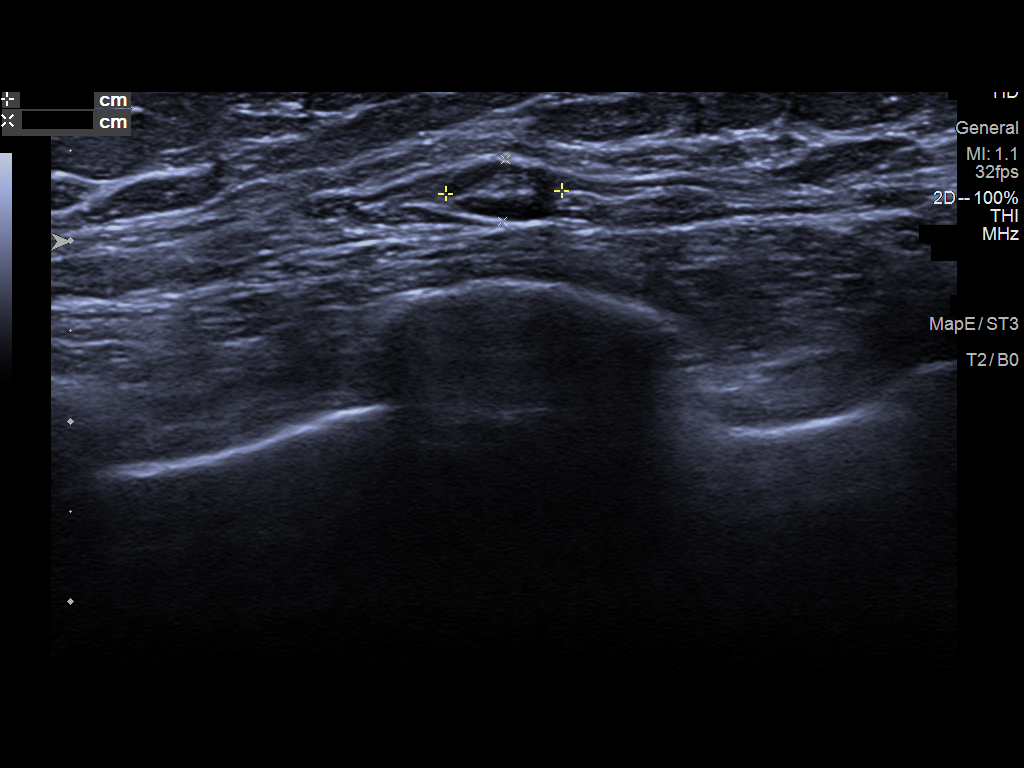
[im 3/5]
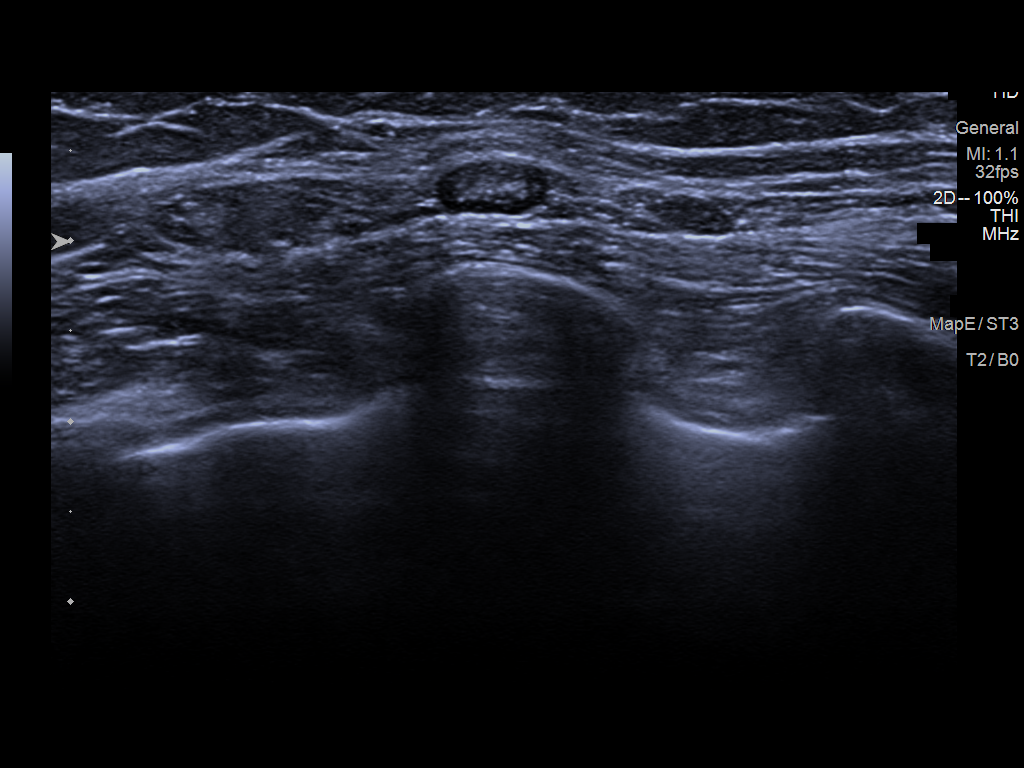
[im 4/5]
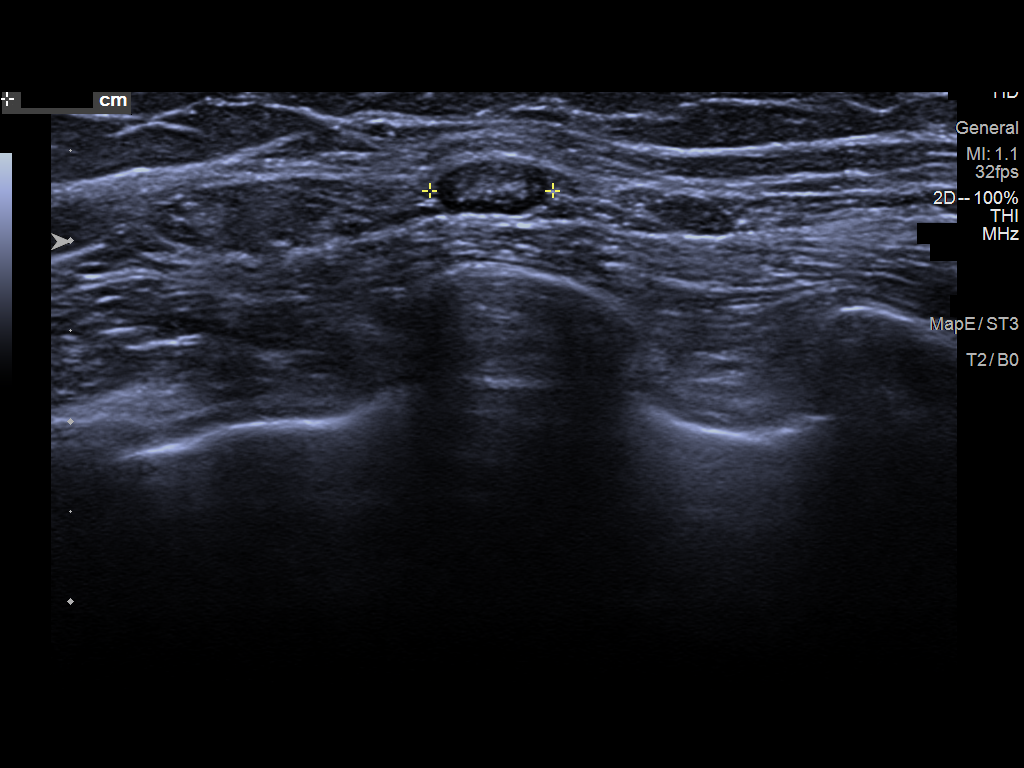
[im 5/5]
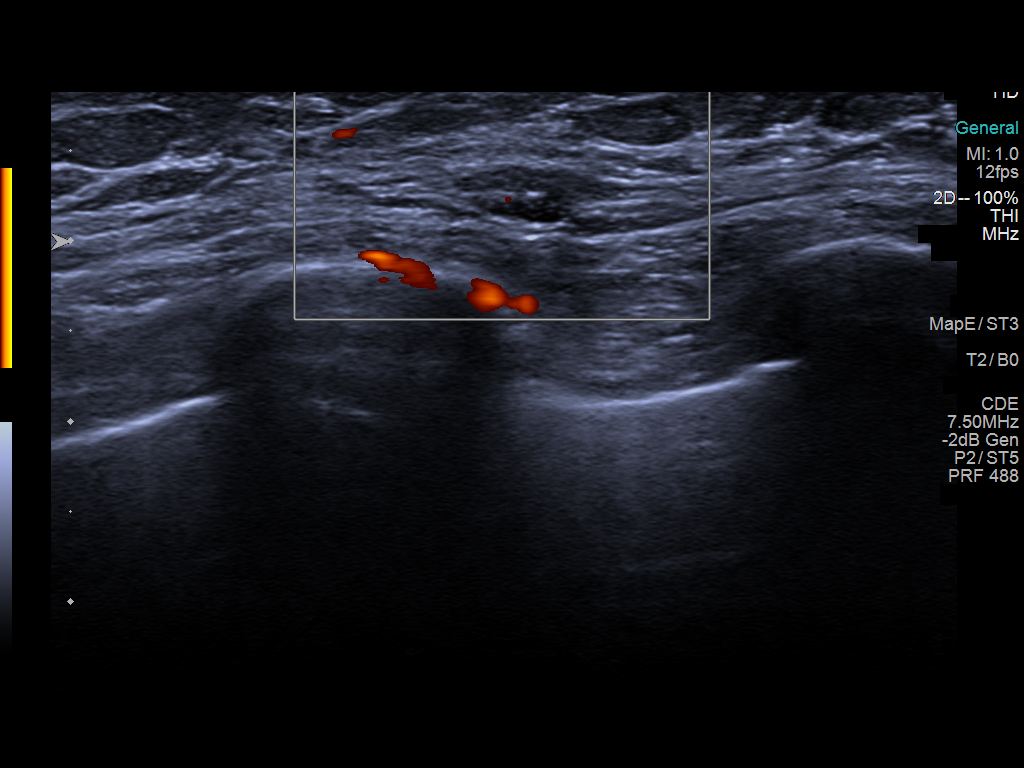

[5 of 5 positions shown; findings below may reference images not displayed]

FINDINGS: Targeted ultrasound is performed, showing a benign intramammary
lymph node in the left breast at 3 o'clock 7 cm from the nipple
measuring 6 mm. There is no cortical thickening. Normal lymph nodes
are seen in the left axilla.
IMPRESSION: Benign intramammary lymph node in the left breast. No evidence of
malignancy.

RECOMMENDATION:
Bilateral screening mammogram in 1 year is recommended.

I have discussed the findings and recommendations with the patient.
Results were also provided in writing at the conclusion of the
visit. If applicable, a reminder letter will be sent to the patient
regarding the next appointment.

BI-RADS CATEGORY  2: Benign.

## 2021-06-28 ENCOUNTER — Other Ambulatory Visit: Payer: Self-pay

## 2021-06-28 ENCOUNTER — Ambulatory Visit
Admission: EM | Admit: 2021-06-28 | Discharge: 2021-06-28 | Disposition: A | Discharge status: Home / Self Care | Payer: 59 | Attending: Emergency Medicine | Admitting: Emergency Medicine

## 2021-06-28 ENCOUNTER — Encounter: Payer: Self-pay | Admitting: *Deleted

## 2021-06-28 DIAGNOSIS — H6692 Otitis media, unspecified, left ear: Secondary | ICD-10-CM | POA: Diagnosis not present

## 2021-06-28 DIAGNOSIS — R519 Headache, unspecified: Secondary | ICD-10-CM

## 2021-06-28 DIAGNOSIS — R509 Fever, unspecified: Secondary | ICD-10-CM | POA: Diagnosis not present

## 2021-06-28 MED ORDER — ACETAMINOPHEN 325 MG PO TABS
650.0000 mg | ORAL_TABLET | Freq: Once | ORAL | Status: AC
  Administered 2021-06-28: 650 mg via ORAL

## 2021-06-28 MED ORDER — AMOXICILLIN 875 MG PO TABS: 875.0000 mg | ORAL_TABLET | Freq: Two times a day (BID) | ORAL | 0 refills | Status: AC

## 2021-06-28 NOTE — ED Provider Notes (Signed)
Roderic Palau    CSN: [id number] Arrival date & time: 06/28/21  3244      History   Chief Complaint Chief Complaint  Patient presents with   Otalgia   Fever   Headache    HPI Regina Hickman is a 56 y.o. female.  Patient presents with fever, headache, left ear pain since yesterday.  Treatment at home with allergy medication taken yesterday; no OTC medications taken today.  She denies sore throat, cough, shortness of breath, vomiting, diarrhea, or other symptoms.  The history is provided by the patient and medical records.    Past Medical History:  Diagnosis Date   CIN II (cervical intraepithelial neoplasia II)    HGSIL (high grade squamous intraepithelial lesion) on Pap smear of cervix    Moderate cervical dysplasia     There are no problems to display for this patient.   Past Surgical History:  Procedure Laterality Date   COLONOSCOPY WITH PROPOFOL N/A 03/13/2018   Procedure: COLONOSCOPY WITH PROPOFOL;  Surgeon: Jonathon Bellows, MD;  Location: [facility] ENDOSCOPY;  Service: Gastroenterology;  Laterality: N/A;   MOUTH SURGERY      OB History     Gravida  2   Para  1   Term  1   Preterm      AB  1   Living  1      SAB  1   IAB      Ectopic      Multiple      Live Births               Home Medications    Prior to Admission medications   Medication Sig Start Date End Date Taking? Authorizing Provider  amoxicillin (AMOXIL) 875 MG tablet Take 1 tablet (875 mg total) by mouth 2 (two) times daily for 7 days. 06/28/21 07/05/21 Yes Sharion Balloon, NP    Family History Family History  Problem Relation Age of Onset   Diabetes Mother    Breast cancer Neg Hx     Social History Social History   Tobacco Use   Smoking status: Never   Smokeless tobacco: Never  Vaping Use   Vaping Use: Never used  Substance Use Topics   Alcohol use: No   Drug use: No     Allergies   Patient has no known allergies.   Review of Systems Review of Systems   Constitutional:  Positive for fever. Negative for chills.  HENT:  Positive for ear pain. Negative for sore throat.   Respiratory:  Negative for cough and shortness of breath.   Cardiovascular:  Negative for chest pain and palpitations.  Gastrointestinal:  Negative for diarrhea and vomiting.  Skin:  Negative for color change and rash.  Neurological:  Positive for headaches.  All other systems reviewed and are negative.    Physical Exam Triage Vital Signs ED Triage Vitals  Enc Vitals Group     BP      Pulse      Resp      Temp      Temp src      SpO2      Weight      Height      Head Circumference      Peak Flow      Pain Score      Pain Loc      Pain Edu?      Excl. in [place]?    No data found.  Updated Vital Signs BP 108/76   Pulse (!) 107   Temp (!) 100.9 F (38.3 C)   Resp 18   SpO2 96%   Visual Acuity Right Eye Distance:   Left Eye Distance:   Bilateral Distance:    Right Eye Near:   Left Eye Near:    Bilateral Near:     Physical Exam Vitals and nursing note reviewed.  Constitutional:      General: She is not in acute distress.    Appearance: Normal appearance. She is well-developed. She is not ill-appearing.  HENT:     Right Ear: Tympanic membrane and ear canal normal.     Left Ear: Ear canal normal. Tympanic membrane is erythematous.     Nose: Nose normal.     Mouth/Throat:     Mouth: Mucous membranes are moist.     Pharynx: Oropharynx is clear.  Cardiovascular:     Rate and Rhythm: Normal rate and regular rhythm.     Heart sounds: Normal heart sounds.  Pulmonary:     Effort: Pulmonary effort is normal. No respiratory distress.     Breath sounds: Normal breath sounds.  Musculoskeletal:     Cervical back: Neck supple.  Skin:    General: Skin is warm and dry.  Neurological:     Mental Status: She is alert.  Psychiatric:        Mood and Affect: Mood normal.        Behavior: Behavior normal.      UC Treatments / Results  Labs (all labs  ordered are listed, but only abnormal results are displayed) Labs Reviewed - No data to display  EKG   Radiology No results found.  Procedures Procedures (including critical care time)  Medications Ordered in UC Medications  acetaminophen (TYLENOL) tablet 650 mg (650 mg Oral Given 06/28/21 0901)    Initial Impression / Assessment and Plan / UC Course  I have reviewed the triage vital signs and the nursing notes.  Pertinent labs & imaging results that were available during my care of the patient were reviewed by me and considered in my medical decision making (see chart for details).    Left otitis media, fever, headache.  Treating with amoxicillin.  Tylenol or ibuprofen as needed.  Instructed patient to follow up with her PCP if her symptoms are not improving.  She agrees to plan of care.    Final Clinical Impressions(s) / UC Diagnoses   Final diagnoses:  Left otitis media, unspecified otitis media type  Fever, unspecified  Acute nonintractable headache, unspecified headache type     Discharge Instructions      Take the amoxicillin as directed.  Take Tylenol or ibuprofen as needed for fever or discomfort.  Follow up with your primary care provider if your symptoms are not improving.        ED Prescriptions     Medication Sig Dispense Auth. Provider   amoxicillin (AMOXIL) 875 MG tablet Take 1 tablet (875 mg total) by mouth 2 (two) times daily for 7 days. 14 tablet Sharion Balloon, NP      PDMP not reviewed this encounter.   Sharion Balloon, NP 06/28/21 [phone]

## 2021-06-28 NOTE — Discharge Instructions (Addendum)
Take the amoxicillin as directed.  Take Tylenol or ibuprofen as needed for fever or discomfort.  Follow up with your primary care provider if your symptoms are not improving.

## 2021-06-28 NOTE — ED Triage Notes (Signed)
Pt reports her HA,fever,and fever started yesterday.

## 2021-07-08 ENCOUNTER — Other Ambulatory Visit (HOSPITAL_COMMUNITY)
Admission: RE | Admit: 2021-07-08 | Discharge: 2021-07-08 | Disposition: A | Discharge status: Home / Self Care | Payer: 59 | Source: Ambulatory Visit | Attending: Certified Nurse Midwife | Admitting: Certified Nurse Midwife

## 2021-07-08 ENCOUNTER — Encounter: Payer: Self-pay | Admitting: Certified Nurse Midwife

## 2021-07-08 ENCOUNTER — Ambulatory Visit: Payer: 59 | Admitting: Certified Nurse Midwife

## 2021-07-08 VITALS — BP 108/71 | HR 78 | Ht 63.0 in | Wt 132.7 lb

## 2021-07-08 DIAGNOSIS — Z124 Encounter for screening for malignant neoplasm of cervix: Secondary | ICD-10-CM | POA: Diagnosis not present

## 2021-07-08 DIAGNOSIS — Z1231 Encounter for screening mammogram for malignant neoplasm of breast: Secondary | ICD-10-CM | POA: Diagnosis not present

## 2021-07-08 DIAGNOSIS — R1031 Right lower quadrant pain: Secondary | ICD-10-CM | POA: Diagnosis not present

## 2021-07-08 DIAGNOSIS — Z01419 Encounter for gynecological examination (general) (routine) without abnormal findings: Secondary | ICD-10-CM | POA: Diagnosis not present

## 2021-07-08 NOTE — Progress Notes (Signed)
GYNECOLOGY ANNUAL PREVENTATIVE CARE ENCOUNTER NOTE  History:     Regina Hickman is a 56 y.o. [id number] female here for a routine annual gynecologic exam.  Current complaints: right lower quadrant pain x 2 weeks.   Denies abnormal vaginal bleeding, discharge, pelvic pain, problems with intercourse or other gynecologic concerns.      Gynecologic History No LMP recorded (lmp unknown). (Menstrual status: Perimenopausal). Contraception: post menopausal status 2019 Last Pap: 02/27/2018. Results were: normal  Last mammogram: 02/2018. Results were: normal  Obstetric History OB History  Gravida Para Term Preterm AB Living  '2 1 1   1 1  '$ SAB IAB Ectopic Multiple Live Births  1            # Outcome Date GA Lbr Len/2nd Weight Sex Delivery Anes PTL Lv  2 Term 10/24/02 [redacted]w[redacted]d 6 lb 2.1 oz (2.781 kg) F Vag-Spont     1 SAB             Past Medical History:  Diagnosis Date   CIN II (cervical intraepithelial neoplasia II)    HGSIL (high grade squamous intraepithelial lesion) on Pap smear of cervix    Moderate cervical dysplasia     Past Surgical History:  Procedure Laterality Date   COLONOSCOPY WITH PROPOFOL N/A 03/13/2018   Procedure: COLONOSCOPY WITH PROPOFOL;  Surgeon: AJonathon Bellows MD;  [facility];  Service: Gastroenterology;  Laterality: N/A;   MOUTH SURGERY      No current outpatient medications on file prior to visit.   No current facility-administered medications on file prior to visit.    No Known Allergies  Social History:  reports that she has never smoked. She has never used smokeless tobacco. She reports that she does not drink alcohol and does not use drugs.  Family History  Problem Relation Age of Onset   Diabetes Mother    Breast cancer Neg Hx     The following portions of the patient's history were reviewed and updated as appropriate: allergies, current medications, past family history, past medical history, past social history, past surgical history  and problem list.  Review of Systems Pertinent items noted in HPI and remainder of comprehensive ROS otherwise negative.  Physical Exam:  BP 108/71   Pulse 78   Ht '5\' 3"'$  (1.6 m)   Wt 132 lb 11.2 oz (60.2 kg)   LMP  (LMP Unknown)   BMI 23.51 kg/m  CONSTITUTIONAL: Well-developed, well-nourished female in no acute distress.  HENT:  Normocephalic, atraumatic, External right and left ear normal. Oropharynx is clear and moist EYES: Conjunctivae and EOM are normal. Pupils are equal, round, and reactive to light. No scleral icterus.  NECK: Normal range of motion, supple, no masses.  Normal thyroid.  SKIN: Skin is warm and dry. No rash noted. Not diaphoretic. No erythema. No pallor. MUSCULOSKELETAL: Normal range of motion. No tenderness.  No cyanosis, clubbing, or edema.  2+ distal pulses. NEUROLOGIC: Alert and oriented to person, place, and time. Normal reflexes, muscle tone coordination.  PSYCHIATRIC: Normal mood and affect. Normal behavior. Normal judgment and thought content. CARDIOVASCULAR: Normal heart rate noted, regular rhythm RESPIRATORY: Clear to auscultation bilaterally. Effort and breath sounds normal, no problems with respiration noted. BREASTS: Symmetric in size. No masses, tenderness, skin changes, nipple drainage, or lymphadenopathy bilaterally.  ABDOMEN: Soft, no distention noted.  No tenderness, rebound or guarding.  PELVIC: Normal appearing external genitalia and urethral meatus; normal appearing vaginal mucosa and cervix.  Contract bleeding. No  abnormal discharge noted.  Pap smear obtained.  Normal uterine size, right lower tenderness on exam, slight enlarged on  right side. No other palpable masses, no uterine or adnexal tenderness.  .   Assessment and Plan:    1. Well woman exam with routine gynecological exam   Pap: Will follow up results of pap smear and manage accordingly. Mammogram : ordered Labs: pt declines, u/s pelvic for right lower quadrant pain Refills:  none Referral: none  Routine preventative health maintenance measures emphasized. Please refer to After Visit Summary for other counseling recommendations.      Philip Aspen, CNM [facility]

## 2021-07-13 LAB — CYTOLOGY - PAP
Comment: NEGATIVE
Diagnosis: NEGATIVE
High risk HPV: NEGATIVE

## 2021-07-15 ENCOUNTER — Ambulatory Visit
Admission: RE | Admit: 2021-07-15 | Discharge: 2021-07-15 | Disposition: A | Discharge status: Home / Self Care | Payer: 59 | Source: Ambulatory Visit | Attending: Certified Nurse Midwife | Admitting: Certified Nurse Midwife

## 2021-07-15 DIAGNOSIS — R1031 Right lower quadrant pain: Secondary | ICD-10-CM | POA: Insufficient documentation

## 2021-07-16 ENCOUNTER — Encounter: Payer: Self-pay | Admitting: Certified Nurse Midwife

## 2021-08-25 ENCOUNTER — Encounter: Payer: 59 | Admitting: Obstetrics and Gynecology

## 2021-10-27 ENCOUNTER — Ambulatory Visit
Admission: EM | Admit: 2021-10-27 | Discharge: 2021-10-27 | Disposition: A | Discharge status: Home / Self Care | Payer: 59 | Attending: Emergency Medicine | Admitting: Emergency Medicine

## 2021-10-27 DIAGNOSIS — R109 Unspecified abdominal pain: Secondary | ICD-10-CM | POA: Diagnosis not present

## 2021-10-27 DIAGNOSIS — M545 Low back pain, unspecified: Secondary | ICD-10-CM | POA: Diagnosis not present

## 2021-10-27 LAB — POCT URINALYSIS DIP (MANUAL ENTRY)
Bilirubin, UA: NEGATIVE
Glucose, UA: NEGATIVE mg/dL
Ketones, POC UA: NEGATIVE mg/dL
Nitrite, UA: NEGATIVE
Protein Ur, POC: NEGATIVE mg/dL
Spec Grav, UA: 1.025 (ref 1.010–1.025)
Urobilinogen, UA: 0.2 E.U./dL
pH, UA: 5.5 (ref 5.0–8.0)

## 2021-10-27 NOTE — ED Triage Notes (Signed)
Patient to Urgent Care with complaints of right sided flank pain, reports pain is intermittent and seems to build but worsens when walking. Describes pain as sharp and stabbing, radiates into her groin. Symptoms started one week ago. Denies any history of kidney stones. Denies any known fevers.    Denies any urinary symptoms.

## 2021-10-27 NOTE — Discharge Instructions (Addendum)
The urine culture is pending.  We will call you if it shows the need for treatment.    Follow up with your primary care provider if your symptoms are not improving.    Go to the emergency department if you have worsening symptoms.

## 2021-10-27 NOTE — ED Provider Notes (Signed)
Regina Hickman    CSN: [id number] Arrival date & time: 10/27/21  1857      History   Chief Complaint Chief Complaint  Patient presents with   Flank Pain    HPI Regina Hickman is a 56 y.o. female.  Patient presents with right side flank and low back pain.  The pain is worse with movement and ambulation.  She is concerned for possible kidney infection.  She denies fever, chills, rash, abdominal pain, dysuria, hematuria, vaginal discharge, pelvic pain, or other symptoms.  No OTC medications taken today.  No falls or trauma.  The history is provided by the patient and medical records.    Past Medical History:  Diagnosis Date   CIN II (cervical intraepithelial neoplasia II)    HGSIL (high grade squamous intraepithelial lesion) on Pap smear of cervix    Moderate cervical dysplasia     There are no problems to display for this patient.   Past Surgical History:  Procedure Laterality Date   COLONOSCOPY WITH PROPOFOL N/A 03/13/2018   Procedure: COLONOSCOPY WITH PROPOFOL;  Surgeon: Jonathon Bellows, MD;  Location: [facility] ENDOSCOPY;  Service: Gastroenterology;  Laterality: N/A;   MOUTH SURGERY      OB History     Gravida  2   Para  1   Term  1   Preterm      AB  1   Living  1      SAB  1   IAB      Ectopic      Multiple      Live Births               Home Medications    Prior to Admission medications   Not on File    Family History Family History  Problem Relation Age of Onset   Diabetes Mother    Breast cancer Neg Hx     Social History Social History   Tobacco Use   Smoking status: Never   Smokeless tobacco: Never  Vaping Use   Vaping Use: Never used  Substance Use Topics   Alcohol use: No   Drug use: No     Allergies   Pumpkin seed and Zinc   Review of Systems Review of Systems  Constitutional:  Negative for chills and fever.  Gastrointestinal:  Negative for abdominal pain, diarrhea, nausea and vomiting.  Genitourinary:   Positive for flank pain. Negative for dysuria, hematuria, pelvic pain and vaginal discharge.  Musculoskeletal:  Positive for back pain. Negative for arthralgias and gait problem.  Skin:  Negative for color change, rash and wound.  Neurological:  Negative for weakness and numbness.  All other systems reviewed and are negative.    Physical Exam Triage Vital Signs ED Triage Vitals  Enc Vitals Group     BP 10/27/21 1917 135/82     Pulse Rate 10/27/21 1917 90     Resp 10/27/21 1917 18     Temp 10/27/21 1917 98.1 F (36.7 C)     Temp src --      SpO2 10/27/21 1917 97 %     Weight 10/27/21 1922 128 lb (58.1 kg)     Height 10/27/21 1922 '5\' 3"'$  (1.6 m)     Head Circumference --      Peak Flow --      Pain Score 10/27/21 1922 3     Pain Loc --      Pain Edu? --  Excl. in [place]? --    No data found.  Updated Vital Signs BP 135/82   Pulse 90   Temp 98.1 F (36.7 C)   Resp 18   Ht '5\' 3"'$  (1.6 m)   Wt 128 lb (58.1 kg)   SpO2 97%   BMI 22.67 kg/m   Visual Acuity Right Eye Distance:   Left Eye Distance:   Bilateral Distance:    Right Eye Near:   Left Eye Near:    Bilateral Near:     Physical Exam Vitals and nursing note reviewed.  Constitutional:      General: She is not in acute distress.    Appearance: Normal appearance. She is well-developed. She is not ill-appearing.  HENT:     Mouth/Throat:     Mouth: Mucous membranes are moist.  Cardiovascular:     Rate and Rhythm: Normal rate and regular rhythm.     Heart sounds: Normal heart sounds.  Pulmonary:     Effort: Pulmonary effort is normal. No respiratory distress.     Breath sounds: Normal breath sounds.  Abdominal:     General: Bowel sounds are normal.     Palpations: Abdomen is soft.     Tenderness: There is no abdominal tenderness. There is no right CVA tenderness, left CVA tenderness, guarding or rebound.  Musculoskeletal:        General: No swelling. Normal range of motion.     Cervical back: Neck supple.   Skin:    General: Skin is warm and dry.     Capillary Refill: Capillary refill takes less than 2 seconds.  Neurological:     General: No focal deficit present.     Mental Status: She is alert and oriented to person, place, and time.     Sensory: No sensory deficit.     Motor: No weakness.     Gait: Gait normal.  Psychiatric:        Mood and Affect: Mood normal.        Behavior: Behavior normal.      UC Treatments / Results  Labs (all labs ordered are listed, but only abnormal results are displayed) Labs Reviewed  POCT URINALYSIS DIP (MANUAL ENTRY) - Abnormal; Notable for the following components:      Result Value   Blood, UA trace-intact (*)    Leukocytes, UA Trace (*)    All other components within normal limits  URINE CULTURE    EKG   Radiology No results found.  Procedures Procedures (including critical care time)  Medications Ordered in UC Medications - No data to display  Initial Impression / Assessment and Plan / UC Course  I have reviewed the triage vital signs and the nursing notes.  Pertinent labs & imaging results that were available during my care of the patient were reviewed by me and considered in my medical decision making (see chart for details).    Right low back pain, right leg pain.  Urine culture pending.  Discussed with patient that we will call her if it shows the need for treatment.  She declines ibuprofen or muscle relaxer.  Education provided on back pain and flank pain.  Instructed patient to follow-up with her PCP if her symptoms are not improving.  ED precautions discussed.  She agrees to plan of care.  Final Clinical Impressions(s) / UC Diagnoses   Final diagnoses:  Acute right flank pain  Acute right-sided low back pain without sciatica     Discharge Instructions  The urine culture is pending.  We will call you if it shows the need for treatment.    Follow up with your primary care provider if your symptoms are not  improving.    Go to the emergency department if you have worsening symptoms.        ED Prescriptions   None    PDMP not reviewed this encounter.   Sharion Balloon, NP 10/27/21 1949

## 2021-10-29 LAB — URINE CULTURE: Culture: NO GROWTH

## 2022-01-18 ENCOUNTER — Ambulatory Visit
Admission: RE | Admit: 2022-01-18 | Discharge: 2022-01-18 | Disposition: A | Discharge status: Home / Self Care | Payer: 59 | Source: Ambulatory Visit | Attending: Certified Nurse Midwife | Admitting: Certified Nurse Midwife

## 2022-01-18 DIAGNOSIS — Z1231 Encounter for screening mammogram for malignant neoplasm of breast: Secondary | ICD-10-CM | POA: Insufficient documentation

## 2022-04-20 ENCOUNTER — Ambulatory Visit
Admission: EM | Admit: 2022-04-20 | Discharge: 2022-04-20 | Disposition: A | Discharge status: Home / Self Care | Payer: 59 | Attending: Urgent Care | Admitting: Urgent Care

## 2022-04-20 DIAGNOSIS — L237 Allergic contact dermatitis due to plants, except food: Secondary | ICD-10-CM

## 2022-04-20 MED ORDER — PREDNISONE 10 MG PO TABS: ORAL_TABLET | ORAL | 0 refills | Status: AC

## 2022-04-20 NOTE — ED Provider Notes (Addendum)
Renaldo Fiddler    CSN: [id number] Arrival date & time: 04/20/22  1814      History   Chief Complaint No chief complaint on file.   HPI Regina Hickman is a 57 y.o. female.   HPI  Patient presents to urgent care with complaint of poison ivy rash.  She endorses patches of small bumps on her legs that is now spread to her arms.  She states she was in her yard last week and around poison ivy.  Past Medical History:  Diagnosis Date   CIN II (cervical intraepithelial neoplasia II)    HGSIL (high grade squamous intraepithelial lesion) on Pap smear of cervix    Moderate cervical dysplasia     There are no problems to display for this patient.   Past Surgical History:  Procedure Laterality Date   COLONOSCOPY WITH PROPOFOL N/A 03/13/2018   Procedure: COLONOSCOPY WITH PROPOFOL;  Surgeon: Wyline Mood, MD;  Location: [facility] ENDOSCOPY;  Service: Gastroenterology;  Laterality: N/A;   MOUTH SURGERY      OB History     Gravida  2   Para  1   Term  1   Preterm      AB  1   Living  1      SAB  1   IAB      Ectopic      Multiple      Live Births               Home Medications    Prior to Admission medications   Not on File    Family History Family History  Problem Relation Age of Onset   Diabetes Mother    Breast cancer Neg Hx     Social History Social History   Tobacco Use   Smoking status: Never   Smokeless tobacco: Never  Vaping Use   Vaping Use: Never used  Substance Use Topics   Alcohol use: No   Drug use: No     Allergies   Pumpkin seed and Zinc   Review of Systems Review of Systems   Physical Exam Triage Vital Signs ED Triage Vitals  Enc Vitals Group     BP      Pulse      Resp      Temp      Temp src      SpO2      Weight      Height      Head Circumference      Peak Flow      Pain Score      Pain Loc      Pain Edu?      Excl. in [place]?    No data found.  Updated Vital Signs There were no vitals taken  for this visit.  Visual Acuity Right Eye Distance:   Left Eye Distance:   Bilateral Distance:    Right Eye Near:   Left Eye Near:    Bilateral Near:     Physical Exam Vitals reviewed.  Constitutional:      Appearance: Normal appearance.  Skin:    General: Skin is warm and dry.     Findings: Erythema and rash present. Rash is papular.       Neurological:     General: No focal deficit present.     Mental Status: She is alert and oriented to person, place, and time.  Psychiatric:        Mood  and Affect: Mood normal.        Behavior: Behavior normal.      UC Treatments / Results  Labs (all labs ordered are listed, but only abnormal results are displayed) Labs Reviewed - No data to display  EKG   Radiology No results found.  Procedures Procedures (including critical care time)  Medications Ordered in UC Medications - No data to display  Initial Impression / Assessment and Plan / UC Course  I have reviewed the triage vital signs and the nursing notes.  Pertinent labs & imaging results that were available during my care of the patient were reviewed by me and considered in my medical decision making (see chart for details).   Presumed atopic dermatitis.  Recommending treatment with corticosteroid.  Will prescribe prednisone.  Counseled patient on potential for adverse effects with medications prescribed/recommended today, ER and return-to-clinic precautions discussed, patient verbalized understanding and agreement with care plan.   Final Clinical Impressions(s) / UC Diagnoses   Final diagnoses:  None   Discharge Instructions   None    ED Prescriptions   None    PDMP not reviewed this encounter.   Charma Igo, FNP 04/20/22 1947    Charma Igo, FNP 04/20/22 1948

## 2022-04-20 NOTE — ED Triage Notes (Signed)
Wednesday pt noticed small rash/bumps on legs that has spread to arms and both legs. Pt was out in yard last weekend around poison ivy and thinks that is what caused rash.

## 2022-04-20 NOTE — Discharge Instructions (Signed)
Follow up here or with your primary care provider if your symptoms are worsening or not improving.
